# Patient Record
Sex: Male | Born: 1995 | Race: Black or African American | Hispanic: No | Marital: Single | Smoking: Current every day smoker
Health system: Southern US, Community
[De-identification: ages and names within clinical notes are randomized; demographics above are authoritative.]

---
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
## 2020-10-04 ENCOUNTER — Other Ambulatory Visit: Payer: Self-pay

## 2020-10-04 ENCOUNTER — Encounter (HOSPITAL_COMMUNITY): Payer: Self-pay | Admitting: *Deleted

## 2020-10-04 ENCOUNTER — Emergency Department (HOSPITAL_COMMUNITY)
Admission: EM | Admit: 2020-10-04 | Discharge: 2020-10-04 | Disposition: A | Discharge status: Home / Self Care | Payer: Self-pay | Attending: Emergency Medicine | Admitting: Emergency Medicine

## 2020-10-04 DIAGNOSIS — J069 Acute upper respiratory infection, unspecified: Secondary | ICD-10-CM | POA: Insufficient documentation

## 2020-10-04 DIAGNOSIS — Z20822 Contact with and (suspected) exposure to covid-19: Secondary | ICD-10-CM | POA: Insufficient documentation

## 2020-10-04 DIAGNOSIS — F17210 Nicotine dependence, cigarettes, uncomplicated: Secondary | ICD-10-CM | POA: Insufficient documentation

## 2020-10-04 LAB — RESP PANEL BY RT-PCR (FLU A&B, COVID) ARPGX2
Influenza A by PCR: NEGATIVE
Influenza B by PCR: NEGATIVE
SARS Coronavirus 2 by RT PCR: NEGATIVE

## 2020-10-04 NOTE — ED Triage Notes (Signed)
Requesting a covid test to return to work

## 2020-10-04 NOTE — Discharge Instructions (Addendum)
Please follow-up on your COVID results. You appear to have an upper respiratory infection (URI). An upper respiratory tract infection, or cold, is a viral infection of the air passages leading to the lungs. It is contagious and can be spread to others, especially during the first 3 or 4 days. It cannot be cured by antibiotics or other medicines. RETURN IMMEDIATELY IF you develop shortness of breath, confusion or altered mental status, a new rash, become dizzy, faint, or poorly responsive, or are unable to be cared for at home.

## 2020-10-04 NOTE — ED Provider Notes (Signed)
[facility] EMERGENCY DEPARTMENT Provider Note   CSN: [id number] Arrival date & time: 10/04/20  1106     History Chief Complaint  Patient presents with   Covid Exposure    Paul Owens is a 25 y.o. male with known covid exposure over the weekend. On Monday he began having sxs of headache and has runny nose.  He denies cough, fevers, chills, nausea, vomiting.  He has been isolating at home and out of work over the past 3 days.  He has no other complaints at this time  HPI     History reviewed. No pertinent past medical history.  There are no problems to display for this patient.   History reviewed. No pertinent surgical history.     Family History  Problem Relation Age of Onset   Asthma Mother    Asthma Brother     Social History   Tobacco Use   Smoking status: Every Day    Types: Cigarettes   Smokeless tobacco: Never  Substance Use Topics   Alcohol use: No   Drug use: No    Home Medications Prior to Admission medications   Not on File    Allergies    Patient has no known allergies.  Review of Systems   Review of Systems  Constitutional:  Negative for chills and fever.  HENT:  Positive for rhinorrhea.   Respiratory:  Negative for cough and shortness of breath.   Gastrointestinal:  Negative for vomiting.  Musculoskeletal:  Negative for arthralgias and myalgias.  Neurological:  Positive for headaches.   Physical Exam Updated Vital Signs BP 109/88 (BP Location: Left Arm)   Pulse 66   Temp 98.3 F (36.8 C) (Oral)   Resp 14   Ht 6' 1.5" (1.867 m)   Wt 68.4 kg   SpO2 100%   BMI 19.63 kg/m   Physical Exam Vitals and nursing note reviewed.  Constitutional:      General: He is not in acute distress.    Appearance: He is well-developed. He is not diaphoretic.  HENT:     Head: Normocephalic and atraumatic.  Eyes:     General: No scleral icterus.    Conjunctiva/sclera: Conjunctivae normal.  Cardiovascular:     Rate and Rhythm: Normal rate  and regular rhythm.     Heart sounds: Normal heart sounds.  Pulmonary:     Effort: Pulmonary effort is normal. No respiratory distress.     Breath sounds: Normal breath sounds.  Abdominal:     Palpations: Abdomen is soft.     Tenderness: There is no abdominal tenderness.  Musculoskeletal:     Cervical back: Normal range of motion and neck supple.  Skin:    General: Skin is warm and dry.  Neurological:     Mental Status: He is alert.  Psychiatric:        Behavior: Behavior normal.    ED Results / Procedures / Treatments   Labs (all labs ordered are listed, but only abnormal results are displayed) Labs Reviewed  RESP PANEL BY RT-PCR (FLU A&B, COVID) ARPGX2    EKG None  Radiology No results found.  Procedures Procedures   Medications Ordered in ED Medications - No data to display  ED Course  I have reviewed the triage vital signs and the nursing notes.  Pertinent labs & imaging results that were available during my care of the patient were reviewed by me and considered in my medical decision making (see chart for details).  MDM Rules/Calculators/A&P                           Patient with known COVID exposure, mild URI symptoms.  Otherwise hemodynamically stable.  Respiratory panel has returned negative for COVID or influenza.  Patient appears to have a URI.  Discussed return precautions and outpatient follow-up as well as home isolation precautions. Final Clinical Impression(s) / ED Diagnoses Final diagnoses:  Upper respiratory tract infection, unspecified type    Rx / DC Orders ED Discharge Orders     None        Arthor Captain, PA-C 10/05/20 0840    Sloan Leiter, DO 10/05/20 1927

## 2021-09-09 ENCOUNTER — Emergency Department (HOSPITAL_COMMUNITY)
Admission: EM | Admit: 2021-09-09 | Discharge: 2021-09-09 | Disposition: A | Discharge status: Home / Self Care | Payer: No Typology Code available for payment source | Attending: Emergency Medicine | Admitting: Emergency Medicine

## 2021-09-09 ENCOUNTER — Emergency Department (HOSPITAL_COMMUNITY): Payer: No Typology Code available for payment source

## 2021-09-09 ENCOUNTER — Other Ambulatory Visit: Payer: Self-pay

## 2021-09-09 DIAGNOSIS — Y92410 Unspecified street and highway as the place of occurrence of the external cause: Secondary | ICD-10-CM | POA: Insufficient documentation

## 2021-09-09 DIAGNOSIS — M549 Dorsalgia, unspecified: Secondary | ICD-10-CM | POA: Insufficient documentation

## 2021-09-09 DIAGNOSIS — S0990XA Unspecified injury of head, initial encounter: Secondary | ICD-10-CM | POA: Diagnosis present

## 2021-09-09 DIAGNOSIS — S199XXA Unspecified injury of neck, initial encounter: Secondary | ICD-10-CM | POA: Insufficient documentation

## 2021-09-09 MED ORDER — IBUPROFEN 800 MG PO TABS: 800.0000 mg | ORAL_TABLET | Freq: Three times a day (TID) | ORAL | 0 refills | Status: AC | PRN

## 2021-09-09 NOTE — Discharge Instructions (Signed)
Follow-up with your doctor if any problems 
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
## 2022-09-25 ENCOUNTER — Encounter (HOSPITAL_COMMUNITY): Payer: Self-pay

## 2022-09-25 ENCOUNTER — Emergency Department (HOSPITAL_COMMUNITY)
Admission: EM | Admit: 2022-09-25 | Discharge: 2022-09-25 | Disposition: A | Discharge status: Home / Self Care | Payer: Self-pay | Attending: Emergency Medicine | Admitting: Emergency Medicine

## 2022-09-25 ENCOUNTER — Other Ambulatory Visit: Payer: Self-pay

## 2022-09-25 DIAGNOSIS — K0889 Other specified disorders of teeth and supporting structures: Secondary | ICD-10-CM | POA: Insufficient documentation

## 2022-09-25 MED ORDER — AMOXICILLIN 500 MG PO CAPS: 500.0000 mg | ORAL_CAPSULE | Freq: Three times a day (TID) | ORAL | 0 refills | Status: DC

## 2022-09-25 MED ORDER — MELOXICAM 7.5 MG PO TABS: 7.5000 mg | ORAL_TABLET | Freq: Every day | ORAL | 0 refills | Status: DC

## 2022-09-25 NOTE — ED Provider Notes (Signed)
[facility] EMERGENCY DEPARTMENT AT [facility] Provider Note   CSN: [id number] Arrival date & time: 09/25/22  1441     History  Chief Complaint  Patient presents with   Dental Pain    Paul Owens is a 27 y.o. male.  Denies any segment past medical history.  Presents to ER complaining of right lower dental pain x 1 week.  No facial swelling, no trouble swallowing or breathing.  States he was told the dentist would not see him or fix his tooth until he was on antibiotics so came in today.  Denies fevers or chills or other complaints.  He states that pain is not relieved with ibuprofen 800 mg that he has been taking at home.   Dental Pain      Home Medications Prior to Admission medications   Medication Sig Start Date End Date Taking? Authorizing Provider  amoxicillin (AMOXIL) 500 MG capsule Take 1 capsule (500 mg total) by mouth 3 (three) times daily. 09/25/22  Yes Laylynn Campanella A, PA-C  meloxicam (MOBIC) 7.5 MG tablet Take 1 tablet (7.5 mg total) by mouth daily. 09/25/22  Yes Rice Walsh A, PA-C  ibuprofen (ADVIL) 800 MG tablet Take 1 tablet (800 mg total) by mouth every 8 (eight) hours as needed for moderate pain. 09/09/21   Bethann Berkshire, MD      Allergies    Patient has no known allergies.    Review of Systems   Review of Systems  Physical Exam Updated Vital Signs BP 123/86   Pulse (!) 58   Temp 98 F (36.7 C)   Resp 20   Ht 6\' 1"  (1.854 m)   Wt 68 kg   SpO2 99%   BMI 19.79 kg/m  Physical Exam Vitals and nursing note reviewed.  Constitutional:      General: He is not in acute distress.    Appearance: He is well-developed.  HENT:     Head: Normocephalic and atraumatic.     Mouth/Throat:     Mouth: Mucous membranes are moist.     Pharynx: No posterior oropharyngeal erythema.     Comments: Right lower third molar has moderately sized cavity.  Mild tenderness.  There is no trismus, phonation is normal, patient speaks in full and clear  sentences handles oral secretions well Eyes:     Extraocular Movements: Extraocular movements intact.     Conjunctiva/sclera: Conjunctivae normal.     Pupils: Pupils are equal, round, and reactive to light.  Cardiovascular:     Rate and Rhythm: Normal rate and regular rhythm.     Heart sounds: No murmur heard. Pulmonary:     Effort: Pulmonary effort is normal. No respiratory distress.     Breath sounds: Normal breath sounds.  Abdominal:     Palpations: Abdomen is soft.     Tenderness: There is no abdominal tenderness.  Musculoskeletal:        General: No swelling. Normal range of motion.     Cervical back: Neck supple.  Skin:    General: Skin is warm and dry.     Capillary Refill: Capillary refill takes less than 2 seconds.  Neurological:     General: No focal deficit present.     Mental Status: He is alert and oriented to person, place, and time.  Psychiatric:        Mood and Affect: Mood normal.     ED Results / Procedures / Treatments   Labs (all labs ordered are listed, but  only abnormal results are displayed) Labs Reviewed - No data to display  EKG None  Radiology No results found.  Procedures Procedures    Medications Ordered in ED Medications - No data to display  ED Course/ Medical Decision Making/ A&P                                 Medical Decision Making Ddx: dental abscess, dental caries, dental fracture, alveolar osteitis, ludwigs angina, other ED course: Patient present with pain in the dental pain. They speak in full and clear sentences,  handle their oral secretions well. There is no sign of deep space abscess. There is no drainable collection.  Pain management: Pt given meloxicam for pain control at home, he states he needs to leave requesting prescription sent to pharmacy rather than given in the ED Antibiotics provided for dental infection, patient was provided with outpatient dental resources and advised to come back to the ED for any new or  worsening symptoms.   Risk Prescription drug management.           Final Clinical Impression(s) / ED Diagnoses Final diagnoses:  Pain, dental    Rx / DC Orders ED Discharge Orders          Ordered    amoxicillin (AMOXIL) 500 MG capsule  3 times daily        09/25/22 1718    meloxicam (MOBIC) 7.5 MG tablet  Daily        09/25/22 [address] 09/25/22 Maurine Cane, MD 09/26/22 1217

## 2022-09-25 NOTE — ED Triage Notes (Signed)
Hole in tooth  Bottom RIGHT side of mouth Painful x1 week  Denies fevers and difficulty swallowing

## 2022-09-25 NOTE — Discharge Instructions (Signed)
Seen today for dental pain.  You have been given prescription for Mobic for pain and amoxicillin which is an antibiotic.  Do not take ibuprofen or Aleve over-the-counter with the meloxicam.  Follow-up with a dentist.  Come back to the ER for new or worsening symptoms.

## 2023-05-31 ENCOUNTER — Emergency Department (HOSPITAL_COMMUNITY)
Admission: EM | Admit: 2023-05-31 | Discharge: 2023-05-31 | Disposition: A | Discharge status: Home / Self Care | Attending: Emergency Medicine | Admitting: Emergency Medicine

## 2023-05-31 ENCOUNTER — Emergency Department (HOSPITAL_COMMUNITY)

## 2023-05-31 DIAGNOSIS — Y92410 Unspecified street and highway as the place of occurrence of the external cause: Secondary | ICD-10-CM | POA: Diagnosis not present

## 2023-05-31 DIAGNOSIS — S3992XA Unspecified injury of lower back, initial encounter: Secondary | ICD-10-CM | POA: Diagnosis present

## 2023-05-31 DIAGNOSIS — S39012A Strain of muscle, fascia and tendon of lower back, initial encounter: Secondary | ICD-10-CM | POA: Diagnosis not present

## 2023-05-31 MED ORDER — CYCLOBENZAPRINE HCL 10 MG PO TABS: 10.0000 mg | ORAL_TABLET | Freq: Three times a day (TID) | ORAL | 0 refills | Status: AC | PRN

## 2023-05-31 NOTE — Discharge Instructions (Addendum)
 Your x-rays show no evidence for fracture.  Your injury appears to be a back strain.  Begin taking ibuprofen  600 mg every 6 hours as needed for pain.  Begin taking Flexeril as prescribed as needed for pain not relieved with ibuprofen .  Follow-up with primary doctor if not improving in the next week.

## 2023-05-31 NOTE — ED Notes (Signed)
 Patient transported to X-ray

## 2023-05-31 NOTE — ED Notes (Signed)
 ED Provider at bedside.
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
## 2023-11-29 ENCOUNTER — Encounter (HOSPITAL_COMMUNITY): Payer: Self-pay

## 2023-11-29 ENCOUNTER — Emergency Department (HOSPITAL_COMMUNITY)
Admission: EM | Admit: 2023-11-29 | Discharge: 2023-11-29 | Disposition: A | Discharge status: Home / Self Care | Payer: Self-pay | Attending: Emergency Medicine | Admitting: Emergency Medicine

## 2023-11-29 ENCOUNTER — Other Ambulatory Visit: Payer: Self-pay

## 2023-11-29 DIAGNOSIS — K0889 Other specified disorders of teeth and supporting structures: Secondary | ICD-10-CM

## 2023-11-29 DIAGNOSIS — K029 Dental caries, unspecified: Secondary | ICD-10-CM | POA: Insufficient documentation

## 2023-11-29 MED ORDER — AMOXICILLIN 500 MG PO CAPS: 500.0000 mg | ORAL_CAPSULE | Freq: Three times a day (TID) | ORAL | 0 refills | Status: AC

## 2023-11-29 MED ORDER — NAPROXEN 250 MG PO TABS
500.0000 mg | ORAL_TABLET | Freq: Once | ORAL | Status: AC
  Administered 2023-11-29: 500 mg via ORAL
  Filled 2023-11-29: qty 2

## 2023-11-29 MED ORDER — MELOXICAM 7.5 MG PO TABS: 7.5000 mg | ORAL_TABLET | Freq: Every day | ORAL | 0 refills | Status: AC

## 2023-11-29 NOTE — ED Provider Notes (Signed)
 [place] [facility]PARTMENT AT [facility] Provider Note   CSN: [id number] Arrival date & time: 11/29/23  9163     Patient presents with: Dental Pain   Paul Owens is a 28 y.o. male.  Denies any other past medical history.  Presents ER for right lower molar pain x 2 weeks that is gradually worsening, denies facial swelling but pain radiates into the jaw and ear, is worse when he chews, denies fever or chills, no trouble swallowing or breathing, no difficulty opening his mouth.  He has not seen a dentist, has been taking ibuprofen  800 mg and states this helps temporarily but the pain always returns.    Dental Pain      Prior to Admission medications   Medication Sig Start Date End Date Taking? Authorizing Provider  amoxicillin  (AMOXIL ) 500 MG capsule Take 1 capsule (500 mg total) by mouth 3 (three) times daily. 09/25/22   [name], PA-C  cyclobenzaprine  (FLEXERIL ) 10 MG tablet Take 1 tablet (10 mg total) by mouth 3 (three) times daily as needed for muscle spasms. 05/31/23   Geroldine Berg, MD  ibuprofen  (ADVIL ) 800 MG tablet Take 1 tablet (800 mg total) by mouth every 8 (eight) hours as needed for moderate pain. 09/09/21   Suzette Pac, MD  meloxicam  (MOBIC ) 7.5 MG tablet Take 1 tablet (7.5 mg total) by mouth daily. 09/25/22   [name], PA-C    Allergies: Patient has no known allergies.    Review of Systems  Updated Vital Signs Temp 97.8 F (36.6 C) (Oral)   Ht 6' 1 (1.854 m)   BMI 20.45 kg/m   Physical Exam Vitals and nursing note reviewed.  Constitutional:      General: He is not in acute distress.    Appearance: He is well-developed.  HENT:     Head: Normocephalic and atraumatic.     Jaw: There is normal jaw occlusion. No trismus, tenderness or swelling.     Right Ear: Tympanic membrane normal.     Left Ear: Ear canal normal.     Mouth/Throat:     Mouth: Mucous membranes are moist.     Dentition: Dental tenderness and dental  caries present. No gingival swelling or dental abscesses.     Tongue: No lesions.     Palate: No mass and lesions.     Pharynx: Oropharynx is clear. Uvula midline.      Comments: Cavity noted to right lower molar Eyes:     Extraocular Movements: Extraocular movements intact.     Conjunctiva/sclera: Conjunctivae normal.     Pupils: Pupils are equal, round, and reactive to light.  Cardiovascular:     Rate and Rhythm: Normal rate and regular rhythm.     Heart sounds: No murmur heard. Pulmonary:     Effort: Pulmonary effort is normal. No respiratory distress.     Breath sounds: Normal breath sounds.  Abdominal:     Palpations: Abdomen is soft.     Tenderness: There is no abdominal tenderness.  Musculoskeletal:        General: No swelling.     Cervical back: Neck supple.  Skin:    General: Skin is warm and dry.     Capillary Refill: Capillary refill takes less than 2 seconds.  Neurological:     Mental Status: He is alert.  Psychiatric:        Mood and Affect: Mood normal.     (all labs ordered are listed, but only abnormal  results are displayed) Labs Reviewed - No data to display  EKG: None  Radiology: No results found.   Procedures   Medications Ordered in the ED - No data to display                                  Medical Decision Making Risk Prescription drug management.   Ddx: dental abscess, dental caries, dental fracture, alveolar osteitis, ludwigs angina, other ED course: Patient present with pain in the right lower molar. They speak in full and clear sentences,  handle their oral secretions well. There is no sign of deep space abscess. There is no drainable collection.  Pain management: Pt given naproxen for pain control Antibiotics provided for dental infection, patient was provided with outpatient dental resources and advised to come back to the ED for any new or worsening symptoms.      Final diagnoses:  None    ED Discharge Orders     None           Suellen Sherran DELENA DEVONNA 11/29/23 1000    Yolande Lamar BROCKS, MD 12/01/23 1251

## 2023-11-29 NOTE — ED Triage Notes (Signed)
 Pt stated dental pain started two weeks ago. Right lower pain. Pain radiates to ear and lower jaw. Difficulty chewing. Does not have dentist at this time.  Denies pain med today.

## 2023-11-29 NOTE — Discharge Instructions (Signed)
 You are seen today for dental pain, we have prescribed antibiotics for infection and meloxicam  for pain, you can also take over-the-counter Tylenol as directed on packaging for pain.  Follow-up with dentist.  I am attaching a resource guide for local dentist, information for the health department, which also provides local dental care, important to follow-up so they can either fix your tooth or pull it if needed to prevent further infections and pain.  Come back to the ER if you have new or worsening symptoms, such if you have swelling of your face neck or jaw, trouble swallowing or breathing, fevers or chills or other worrisome symptoms.
